# Patient Record
Sex: Female | Born: 2005 | Race: White | Hispanic: No | Marital: Single | Smoking: Never smoker
Health system: Southern US, Community
[De-identification: ages and names within clinical notes are randomized; demographics above are authoritative.]

## PROBLEM LIST (undated history)

## (undated) DIAGNOSIS — R011 Cardiac murmur, unspecified: Secondary | ICD-10-CM

## (undated) DIAGNOSIS — L309 Dermatitis, unspecified: Secondary | ICD-10-CM

---
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
## 2023-01-23 ENCOUNTER — Telehealth: Payer: Self-pay

## 2023-01-23 DIAGNOSIS — Z30011 Encounter for initial prescription of contraceptive pills: Secondary | ICD-10-CM

## 2023-01-23 NOTE — Telephone Encounter (Signed)
Pt's mother notified and voiced understanding. States she will talk to pt when she gets home and will call us back tomorrow with decision.

## 2023-01-23 NOTE — Telephone Encounter (Signed)
Yes, please recommend alternating sides and using hydrocortisone if needed.

## 2023-01-23 NOTE — Telephone Encounter (Signed)
Pt's mother Maria Ponce (ok to speak w/ per DPR) calling to report pt having some trouble w/ recent rx for [facility] patches.   She reports that pt placed second patch on the same area where first patch was placed. Mother wasn't sure if pt cleaned area and patted dry prior to applying new patch. However, pt reported that when she removed last patch, it looks like there are cystic (pimple) like bumps under area where patch was. Denies itching/irritation.  Pt's mother reports pt does work out and swim a lot so pt did apply patch to buttock area where patch could not be seen while wearing a bathing suit and tends to sweat so is unsure if sweat and/or moisture could potentially be causing the bumps but desires to inquire your opinion and/or recommendation for pt. Will find out if pt did cleanse/dry area before applying patch in meantime.   Please advise. Thanks.

## 2023-01-23 NOTE — Telephone Encounter (Signed)
Sounds like she may be allergic to the adhesive that is in the patch. Option would be to go back to combination pills (would try a different one since she had BTB with previous one). We had discussed progestin-only options but she does have history of acne and it has been well controlled on combination birth control. Can use OTC hydrocortisone cream on area.

## 2023-02-14 NOTE — Telephone Encounter (Signed)
No returned call received. Will route to provider for final review and close.

## 2023-03-09 MED ORDER — NORGESTIM-ETH ESTRAD TRIPHASIC 0.18/0.215/0.25 MG-25 MCG PO TABS: 1.0000 | ORAL_TABLET | Freq: Every day | ORAL | 1 refills | Status: DC

## 2023-03-09 NOTE — Telephone Encounter (Signed)
Pt LVM in triage line stating that she would like to switch from the patch back to COCs.  Spoke w/ pt and confirmed that request is due to still experiencing the reaction to the patches and the fact that they are not staying on regardless of making sure that area is clean/dry before placing new one on.   FYI. Pt also mentioned has been experiencing BTB while using this patch as well. States she has been bleeding persistently for ~3 weeks now (moderate to light then back to moderate). Denies feeling dizzy/lightheaded but does report slight increase in shakiness but not sure if caffeine related or not. Pt advised of bleeding/dizziness precautions.  Per Tw's notes: for pt to return back to COCs due to hx of acne but not to return to what she was taking most recently due to the BTB.   Last COC rx'd in EMR was blisovi FE 1/20.  Last/first seen on 12/25/2022.  Please advise.

## 2023-03-09 NOTE — Addendum Note (Signed)
Addended by: Jodelle Red D on: 03/09/2023 04:57 PM   Modules accepted: Orders

## 2023-03-09 NOTE — Telephone Encounter (Signed)
Rx sent. Pt notified and voiced understanding. Routing to provider(s) for final review and closing encounter.

## 2023-03-09 NOTE — Telephone Encounter (Signed)
May try Tri Lo Sprintec to help with bleeding and acne

## 2023-07-15 ENCOUNTER — Other Ambulatory Visit: Payer: Self-pay | Admitting: Radiology

## 2023-07-15 DIAGNOSIS — Z30011 Encounter for initial prescription of contraceptive pills: Secondary | ICD-10-CM

## 2023-07-16 NOTE — Telephone Encounter (Signed)
Med refill request: Tri-Lo-Mili Last AEX: 12/25/22 Next AEX: not scheduled Last MMG (if hormonal med) n/a Refill authorized: Please Advise, #[phone], 1 RF

## 2023-11-15 ENCOUNTER — Other Ambulatory Visit: Payer: Self-pay

## 2023-11-15 DIAGNOSIS — Z30011 Encounter for initial prescription of contraceptive pills: Secondary | ICD-10-CM

## 2023-11-15 MED ORDER — NORGESTIMATE-ETH ESTRADIOL 0.18/0.215/0.25 MG-25 MCG PO TABS: 1.0000 | ORAL_TABLET | Freq: Every day | ORAL | 1 refills | Status: DC

## 2023-11-15 NOTE — Telephone Encounter (Signed)
 Medication refill request: tri-lo mili  Last AEX:  12/25/22  Next AEX: not yet scheduled  Last MMG (if hormonal medication request): n/a Refill authorized: please advise

## 2023-12-04 ENCOUNTER — Other Ambulatory Visit: Payer: Self-pay

## 2023-12-04 DIAGNOSIS — Z30011 Encounter for initial prescription of contraceptive pills: Secondary | ICD-10-CM

## 2023-12-04 MED ORDER — NORGESTIMATE-ETH ESTRADIOL 0.18/0.215/0.25 MG-25 MCG PO TABS: 1.0000 | ORAL_TABLET | Freq: Every day | ORAL | 0 refills | Status: DC

## 2023-12-04 NOTE — Telephone Encounter (Signed)
 Patient LM on RX line regarding pharmacy not having RF on Tri Lo Mili.  I spoke to the patient and she said the pharmacy did not fill her OCP.  I contacted [facility] and the pharmacy tech said the prescription was discontinued.   Medication refill request: tri-lo mili  Last AEX:  12/25/22  Next AEX: not yet scheduled  Last MMG (if hormonal medication request): n/a Refill authorized: Tri Lo Mili Please approve or deny as appropriate.

## 2023-12-18 ENCOUNTER — Encounter: Payer: Self-pay | Admitting: Nurse Practitioner

## 2023-12-18 ENCOUNTER — Ambulatory Visit: Admitting: Nurse Practitioner

## 2023-12-18 VITALS — BP 118/70 | HR 80

## 2023-12-18 DIAGNOSIS — Z3009 Encounter for other general counseling and advice on contraception: Secondary | ICD-10-CM

## 2023-12-18 MED ORDER — MISOPROSTOL 200 MCG PO TABS: 400.0000 ug | ORAL_TABLET | Freq: Once | ORAL | 0 refills | Status: DC

## 2023-12-18 NOTE — Progress Notes (Signed)
   Acute Office Visit  Subjective:    Patient ID: Maria Ponce, female    DOB: 07-Oct-2005, 18 y.o.   MRN: [id number]   HPI 18 y.o. G0 presents today for birth control consult. Interested in Mirena IUD. Had spotting with COCs in the past. Stopped COCs about a month ago. Originally started them for acne management. Sexually active. In college, so she wants something she does not have to think about.   Patient's last menstrual period was 12/13/2023 (approximate). Period Cycle (Days): 28 Period Duration (Days): 5 Period Pattern: Regular Menstrual Flow: Heavy, Moderate Menstrual Control: Tampon Dysmenorrhea: (!) Mild Dysmenorrhea Symptoms: Cramping  Review of Systems  Constitutional: Negative.   Genitourinary: Negative.        Objective:     Physical Exam Constitutional:      Appearance: Normal appearance.   GU: Not indicated  BP 118/70 (BP Location: Left Arm, Patient Position: Sitting)   Pulse 80   LMP 12/13/2023 (Approximate)   SpO2 99%  Wt Readings from Last 3 Encounters:  12/25/22 147 lb (66.7 kg) (80%, Z= 0.85)*  03/07/17 124 lb (56.2 kg) (84%, Z= 0.97)*   * Growth percentiles are based on CDC (Girls, 2-20 Years) data.         Assessment & Plan:   Problem List Items Addressed This Visit   None Visit Diagnoses       General counseling and advice on female contraception    -  Primary   Relevant Medications   misoprostol (CYTOTEC) 200 MCG tablet   Other Relevant Orders   IUD Insertion      Plan: Contraceptive options were reviewed, including hormonal methods, both combination (pill, patch, vaginal ring) and progesterone-only (pill, Depo Provera and Nexplanon), intrauterine devices (Mirena, [place], [place]yla, and [place]), Phexxi, barrier methods (condoms, diaphragm) and female/female sterilization. The mechanisms, risks, benefits and side effects of all methods were discussed.   Interested in Mirena IUD and will schedule during menses. Cytotec vaginally at  least 12 hours prior, Ibuprofen 800 mg 1 hour prior. May bring support person. All questions answered.      Andee Bamberger DNP, 10:16 AM 12/18/2023

## 2024-02-11 ENCOUNTER — Ambulatory Visit: Admitting: Nurse Practitioner

## 2024-02-15 ENCOUNTER — Ambulatory Visit: Admitting: Nurse Practitioner

## 2024-03-06 ENCOUNTER — Ambulatory Visit (INDEPENDENT_AMBULATORY_CARE_PROVIDER_SITE_OTHER): Admitting: Nurse Practitioner

## 2024-03-06 ENCOUNTER — Encounter: Payer: Self-pay | Admitting: Nurse Practitioner

## 2024-03-06 VITALS — BP 110/62 | HR 89

## 2024-03-06 DIAGNOSIS — N760 Acute vaginitis: Secondary | ICD-10-CM | POA: Diagnosis not present

## 2024-03-06 DIAGNOSIS — Z30430 Encounter for insertion of intrauterine contraceptive device: Secondary | ICD-10-CM | POA: Diagnosis not present

## 2024-03-06 DIAGNOSIS — Z01812 Encounter for preprocedural laboratory examination: Secondary | ICD-10-CM

## 2024-03-06 LAB — PREGNANCY, URINE: Preg Test, Ur: NEGATIVE

## 2024-03-06 MED ORDER — LEVONORGESTREL 20 MCG/DAY IU IUD
1.0000 | INTRAUTERINE_SYSTEM | Freq: Once | INTRAUTERINE | Status: AC
  Administered 2024-03-06: 1 via INTRAUTERINE

## 2024-03-06 MED ORDER — FLUCONAZOLE 150 MG PO TABS: 150.0000 mg | ORAL_TABLET | ORAL | 0 refills | Status: DC

## 2024-03-06 NOTE — Progress Notes (Signed)
   Maria Ponce 2005/10/22 [id number]   History:  18 y.o. G0 presents for insertion of Mirena  IUD.  Pt has been counseled about risks and benefits as well as complications. Complains of vaginal itching. Self administered Cytotec . Mother present for support.   Patient's last menstrual period was 03/02/2024 (exact date). STD testing: Declines  Past medical history, past surgical history, family history and social history were all reviewed and documented in the EPIC chart.  ROS:  A ROS was performed and pertinent positives and negatives are included.  Exam: Vitals:   03/06/24 1448  BP: 110/62  Pulse: 89  SpO2: 99%   There is no height or weight on file to calculate BMI.  Pelvic exam: Vulva:  normal female genitalia Vagina:  normal vagina, no discharge, exudate, lesion, or erythema Cervix:  Non-tender, Negative CMT, no lesions or redness. Uterus:  normal shape, position and consistency    Procedure:  Timeout performed and written consent obtained.  Speculum inserted.  Cervix visualized and cleansed with Chlorhexidine x 3. Sterile gloves applied. Tenaculum placed on anterior cervix. Then uterus sounded to 6 cm. IUD inserted easily. Strings trimmed to 3 cm.  Minimal bleeding noted.  Pt tolerated the procedure well.  Chaperone present: Dereck Keas, CMA present as chaperone.    Assessment/Plan:  Insertion of Mirena  IUD             UPT neg   Return for recheck 4-6 weeks Pt aware to call for any concerns Pt aware removal due no later than 8 years from insertion date, IUD card given to pt.   Annabella DELENA Shutter DNP, 2:52 PM 03/06/2024

## 2024-05-01 ENCOUNTER — Ambulatory Visit: Admitting: Nurse Practitioner

## 2024-06-16 ENCOUNTER — Ambulatory Visit: Admitting: Nurse Practitioner

## 2024-07-09 ENCOUNTER — Ambulatory Visit: Admitting: Nurse Practitioner

## 2024-07-09 ENCOUNTER — Encounter: Payer: Self-pay | Admitting: Nurse Practitioner

## 2024-07-09 VITALS — BP 116/70 | HR 68 | Resp 16 | Ht 67.25 in | Wt 152.0 lb

## 2024-07-09 DIAGNOSIS — Z01419 Encounter for gynecological examination (general) (routine) without abnormal findings: Secondary | ICD-10-CM

## 2024-07-09 DIAGNOSIS — Z30431 Encounter for routine checking of intrauterine contraceptive device: Secondary | ICD-10-CM

## 2024-07-09 DIAGNOSIS — Z1331 Encounter for screening for depression: Secondary | ICD-10-CM | POA: Diagnosis not present

## 2024-07-09 DIAGNOSIS — Z113 Encounter for screening for infections with a predominantly sexual mode of transmission: Secondary | ICD-10-CM

## 2024-07-09 NOTE — Progress Notes (Signed)
° °  Maria Ponce Nov 09, 2005 [id number]   History:  18 y.o. G0 presents for annual exam. Mirena  02/2024. Spotting for first few months. This has improved some. Completed Gardasil series.   Gynecologic History Patient's last menstrual period was 07/07/2024 (exact date). Period Duration (Days): 3-4 Period Pattern: Regular Menstrual Flow: Light Menstrual Control: Tampon, Maxi pad Dysmenorrhea: (!) Mild Dysmenorrhea Symptoms: Cramping Contraception/Family planning: IUD Sexually active: Yes  Health Maintenance Last Pap: Not indicated Last mammogram: Not indicated Last colonoscopy: Not indicated Last Dexa: Not indicated      07/09/2024    2:40 PM  Depression screen PHQ 2/9  Decreased Interest 0  Down, Depressed, Hopeless 0  PHQ - 2 Score 0     Past medical history, past surgical history, family history and social history were all reviewed and documented in the EPIC chart. 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   ROS:  A ROS was performed and pertinent positives and negatives are included.  Exam:  Vitals:   07/09/24 1436  BP: 116/70  Pulse: 68  Resp: 16  Weight: 152 lb (68.9 kg)  Height: 5' 7.25 (1.708 m)   Body mass index is 23.63 kg/m.  General appearance:  Normal Thyroid:  Symmetrical, normal in size, without palpable masses or nodularity. Respiratory  Auscultation:  Clear without wheezing or rhonchi Cardiovascular  Auscultation:  Regular rate, without rubs, murmurs or gallops  Edema/varicosities:  Not grossly evident Abdominal  Soft,nontender, without masses, guarding or rebound.  Liver/spleen:  No organomegaly noted  Hernia:  None appreciated  Skin  Inspection:  Grossly normal Breasts: Not indicated per guidelines Pelvic: External genitalia:  no lesions              Urethra:  normal appearing urethra with no masses, tenderness or lesions              Bartholins and Skenes: normal                 Vagina: normal appearing vagina with normal  color and discharge, no lesions              Cervix: no lesions. + IUD strings Bimanual Exam:  Uterus:  no masses or tenderness              Adnexa: no mass, fullness, tenderness              Rectovaginal: Deferred              Anus:  normal, no lesions  Maria Ponce, CMA present as chaperone.    Assessment/Plan:  18 y.o. G0 for annual exam.   Well female exam with routine gynecological exam - Education provided on SBEs, importance of preventative screenings, current guidelines, high calcium diet, regular exercise, and multivitamin daily.   Depression screening - PHQ - 0  Encounter for routine checking of intrauterine contraceptive device (IUD). Mirena  IUD 02/2024. Spotting first few months. This has improved some. Aware of 8-year FDA approval.   Screening examination for STD (sexually transmitted disease) - Plan: C. trachomatis/N. gonorrhoeae RNA  Return in about 1 year (around 07/09/2025) for Annual.     Maria DELENA Shutter DNP, 2:53 PM 07/09/2024

## 2024-07-10 LAB — C. TRACHOMATIS/N. GONORRHOEAE RNA
C. trachomatis RNA, TMA: NOT DETECTED
N. gonorrhoeae RNA, TMA: NOT DETECTED

## 2024-07-11 ENCOUNTER — Ambulatory Visit: Payer: Self-pay | Admitting: Nurse Practitioner

## 2025-07-13 ENCOUNTER — Ambulatory Visit: Admitting: Nurse Practitioner
